# Patient Record
Sex: Female | Born: 1960 | Race: White | Hispanic: No
Health system: Southern US, Community
[De-identification: ages and names within clinical notes are randomized; demographics above are authoritative.]

---

## 2009-01-06 ENCOUNTER — Ambulatory Visit (HOSPITAL_COMMUNITY): Admission: RE | Admit: 2009-01-06 | Discharge: 2009-01-06 | Payer: Self-pay | Admitting: General Surgery

## 2009-02-04 ENCOUNTER — Encounter: Admission: RE | Admit: 2009-02-04 | Discharge: 2009-04-23 | Payer: Self-pay | Admitting: General Surgery

## 2009-04-07 ENCOUNTER — Ambulatory Visit (HOSPITAL_COMMUNITY): Admission: RE | Admit: 2009-04-07 | Discharge: 2009-04-08 | Payer: Self-pay | Admitting: General Surgery

## 2009-05-14 ENCOUNTER — Encounter: Admission: RE | Admit: 2009-05-14 | Discharge: 2009-05-14 | Payer: Self-pay | Admitting: General Surgery

## 2010-03-24 LAB — CBC
HCT: 35.5 % — ABNORMAL LOW (ref 36.0–46.0)
Hemoglobin: 12.1 g/dL (ref 12.0–15.0)
MCHC: 34.1 g/dL (ref 30.0–36.0)
MCV: 85.1 fL (ref 78.0–100.0)
Platelets: 247 10*3/uL (ref 150–400)
RDW: 13.9 % (ref 11.5–15.5)

## 2010-03-24 LAB — DIFFERENTIAL
Basophils Absolute: 0 10*3/uL (ref 0.0–0.1)
Basophils Relative: 0 % (ref 0–1)
Lymphocytes Relative: 23 % (ref 12–46)
Neutro Abs: 3.9 10*3/uL (ref 1.7–7.7)
Neutrophils Relative %: 70 % (ref 43–77)

## 2010-03-29 LAB — DIFFERENTIAL
Basophils Relative: 1 % (ref 0–1)
Eosinophils Absolute: 0.2 10*3/uL (ref 0.0–0.7)
Eosinophils Relative: 3 % (ref 0–5)
Lymphs Abs: 2.4 10*3/uL (ref 0.7–4.0)
Monocytes Relative: 10 % (ref 3–12)
Neutrophils Relative %: 45 % (ref 43–77)

## 2010-03-29 LAB — COMPREHENSIVE METABOLIC PANEL
ALT: 123 U/L — ABNORMAL HIGH (ref 0–35)
Alkaline Phosphatase: 59 U/L (ref 39–117)
BUN: 15 mg/dL (ref 6–23)
Chloride: 107 mEq/L (ref 96–112)
GFR calc non Af Amer: >60 mL/min (ref >60)
Glucose, Bld: 100 mg/dL — ABNORMAL HIGH (ref 70–99)
Potassium: 4.7 mEq/L (ref 3.5–5.1)
Total Protein: 7.3 g/dL (ref 6.0–8.3)

## 2010-03-29 LAB — CBC
HCT: 38.3 % (ref 36.0–46.0)
Hemoglobin: 12.9 g/dL (ref 12.0–15.0)
MCV: 86 fL (ref 78.0–100.0)
Platelets: 264 10*3/uL (ref 150–400)
RDW: 13.8 % (ref 11.5–15.5)

## 2010-10-25 ENCOUNTER — Telehealth (INDEPENDENT_AMBULATORY_CARE_PROVIDER_SITE_OTHER): Payer: Self-pay | Admitting: General Surgery

## 2010-10-25 NOTE — Telephone Encounter (Signed)
Recall letter mailed to patient. Adv pt to call our office to schedule an appt for bariatric surgery f/u...cef °

## 2011-07-28 IMAGING — CR DG ABDOMEN 1V
1 series · 1 of 1 positions shown · non-contrast
Comparison: None.

CLINICAL DATA: Post gastric banding

ABDOMEN - 1 VIEW

[t abdomen supine *]
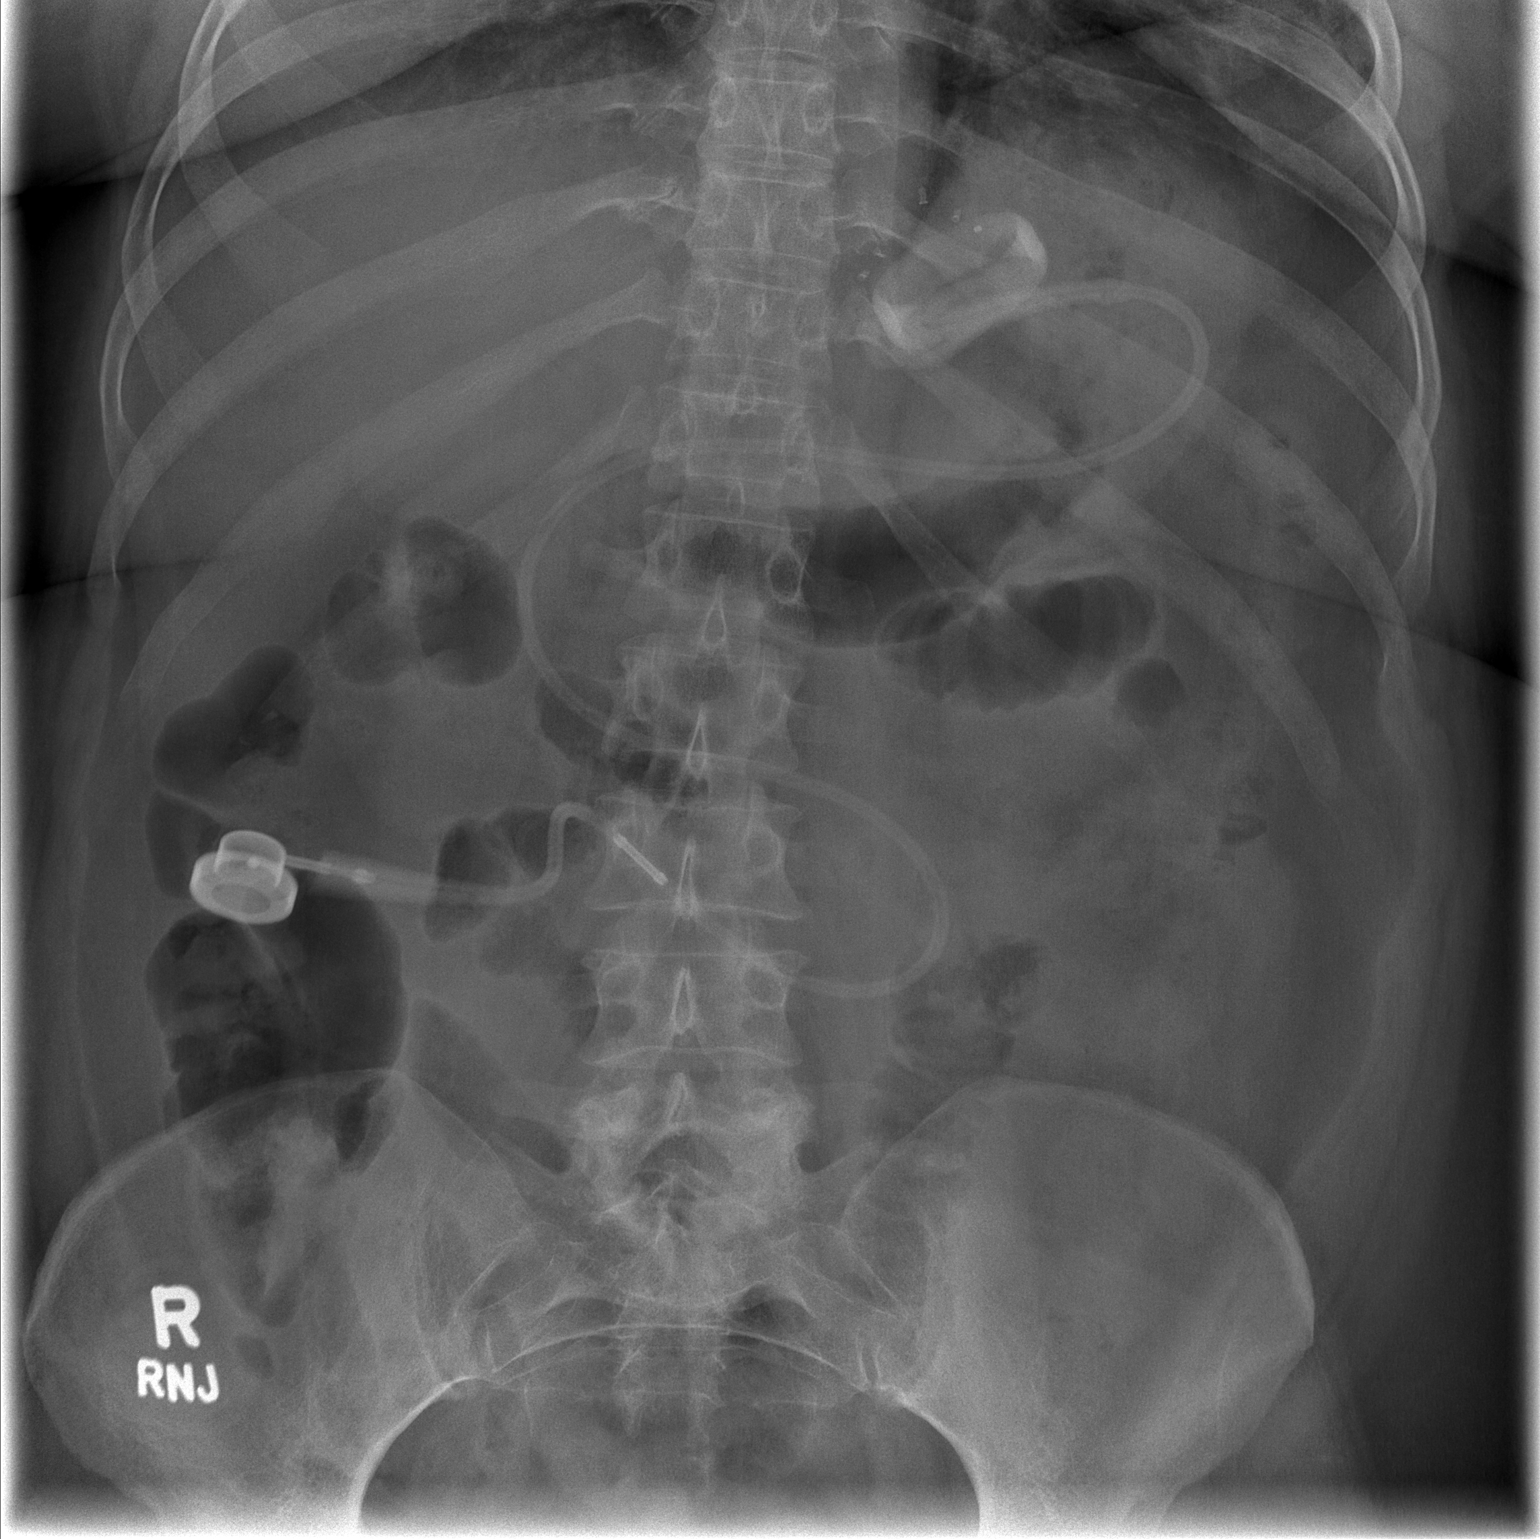

[1 of 1 positions shown; findings below may reference images not displayed]

FINDINGS: Gastric lap band appears unremarkable.  It is inclined
from approximately the 2 o'clock to the 8 o'clock position.

Mild incidental ileus.
IMPRESSION: Gastric lap band appears unremarkable.

## 2015-11-10 ENCOUNTER — Encounter (HOSPITAL_COMMUNITY): Payer: Self-pay

## 2015-12-17 ENCOUNTER — Telehealth (HOSPITAL_COMMUNITY): Payer: Self-pay

## 2015-12-17 NOTE — Telephone Encounter (Signed)
This patient is overdue for recommended follow-up with a bariatric surgeon at [facility]. A letter was mailed to the address on file 11/10/15 from both [place] & [facility] in attempt to reestablish post-op care. The letter included a patient survey which [facility].  Patient declined an appointment advising current insurance does not cover bariatric follow-up.  Copy of the survey was shared with Dario GuardianFrances Jackson at [facility] so she may file in patients office chart.

## 2016-11-07 ENCOUNTER — Encounter (HOSPITAL_COMMUNITY): Payer: Self-pay
